# Patient Record
Sex: Female | Born: 1950 | Race: White | Hispanic: No
Health system: Southern US, Community
[De-identification: ages and names within clinical notes are randomized; demographics above are authoritative.]

---

## 2000-11-21 ENCOUNTER — Other Ambulatory Visit: Admission: RE | Admit: 2000-11-21 | Discharge: 2000-11-21 | Payer: Self-pay | Admitting: Gynecology

## 2004-07-03 ENCOUNTER — Other Ambulatory Visit: Admission: RE | Admit: 2004-07-03 | Discharge: 2004-07-03 | Payer: Self-pay | Admitting: Gynecology

## 2012-09-09 ENCOUNTER — Ambulatory Visit: Payer: Self-pay | Admitting: Family Medicine

## 2013-11-10 ENCOUNTER — Ambulatory Visit: Payer: Self-pay | Admitting: Internal Medicine

## 2013-11-11 ENCOUNTER — Ambulatory Visit: Payer: Self-pay | Admitting: Internal Medicine

## 2013-11-16 ENCOUNTER — Ambulatory Visit: Payer: Self-pay | Admitting: Internal Medicine

## 2013-11-20 LAB — PATHOLOGY REPORT

## 2014-10-11 IMAGING — MG MM DIGITAL SCREENING BILAT W/ CAD
1 series · 8 of 8 positions shown · non-contrast
Comparison: Previous exam(s).

CLINICAL DATA: Screening.

EXAM:
DIGITAL SCREENING BILATERAL MAMMOGRAM WITH CAD
The patient has retropectoral implants. Standard and implant
displaced views were performed.

[R CC · right · 8 of 8 slices shown]
[im 1/8]
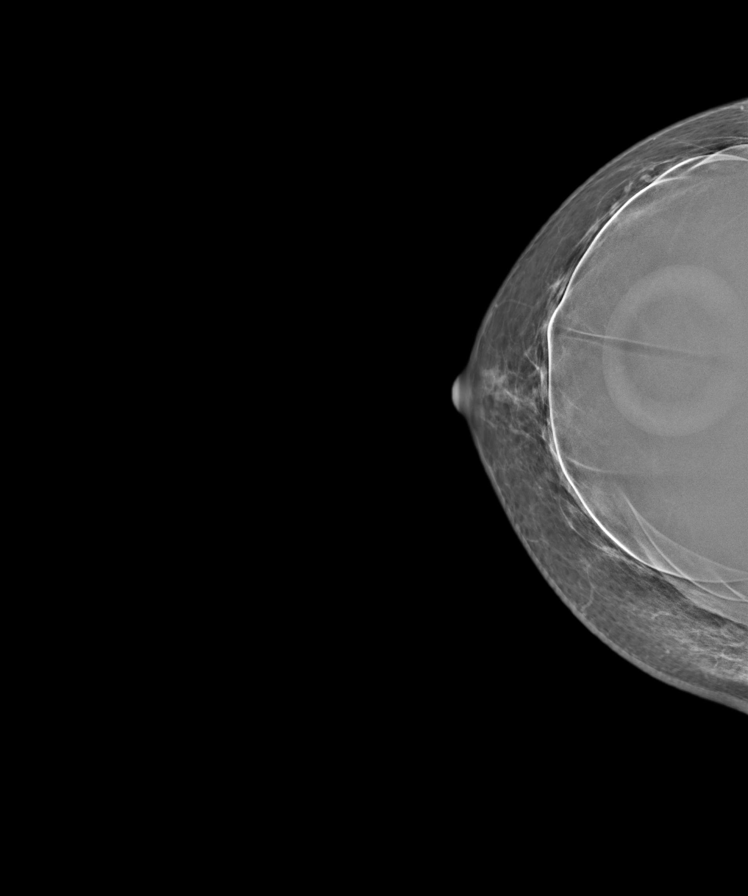
[im 2/8]
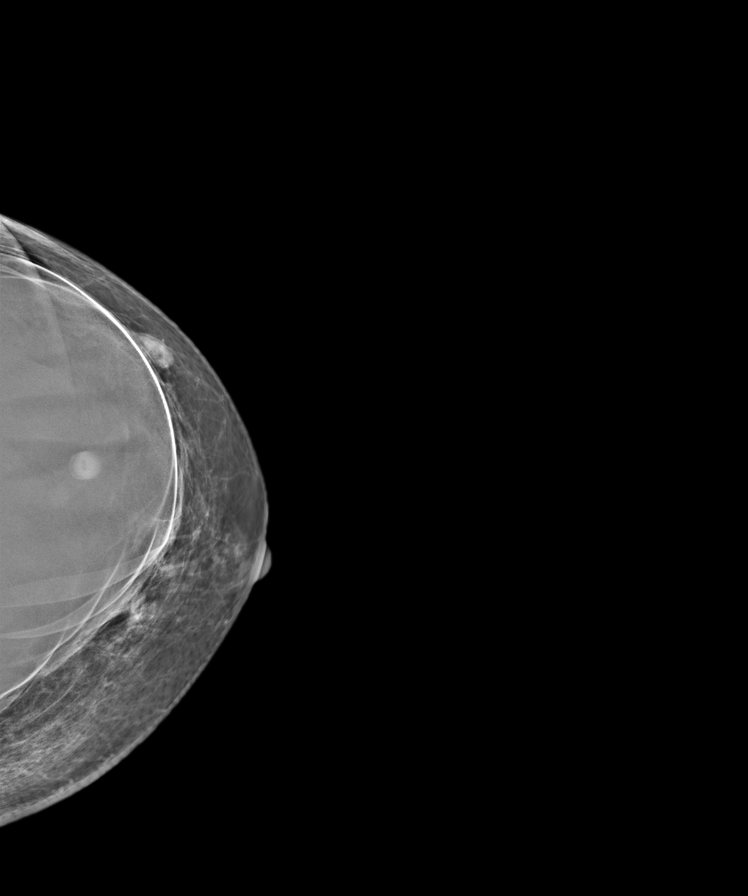
[im 3/8]
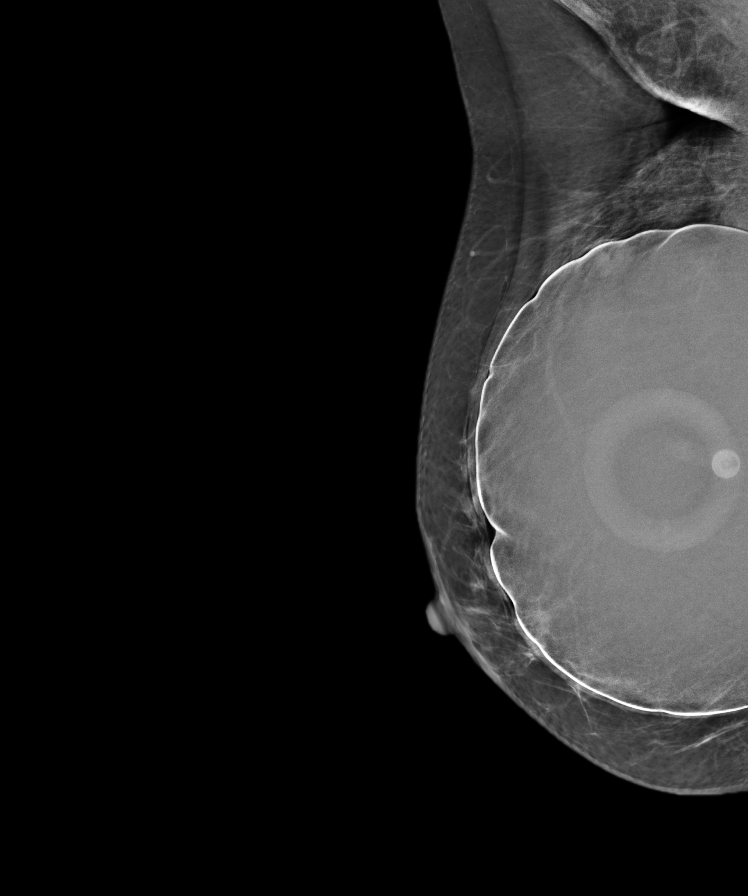
[im 4/8]
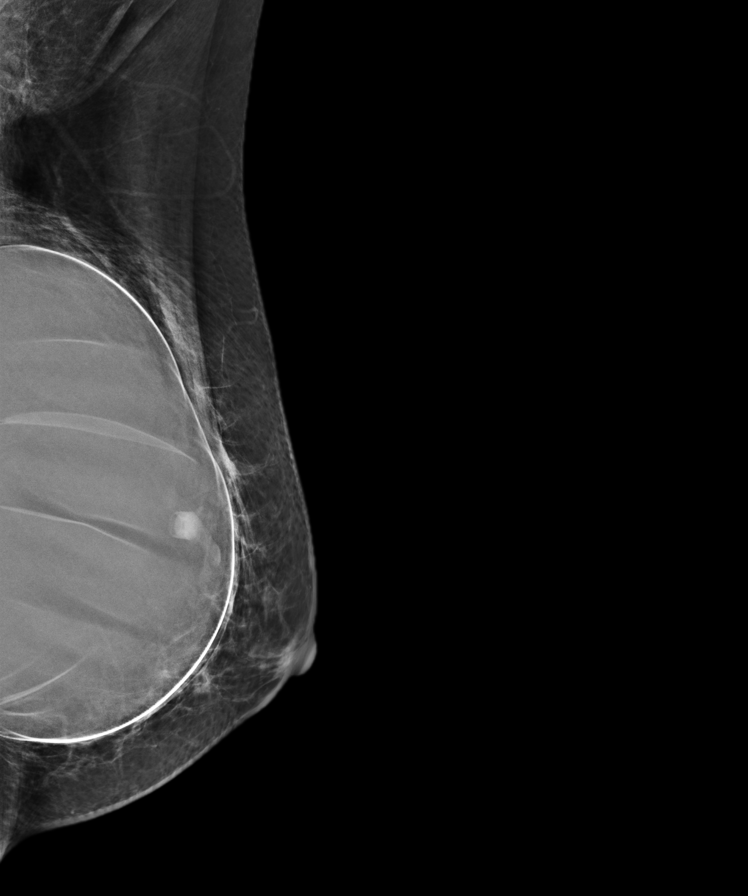
[im 5/8]
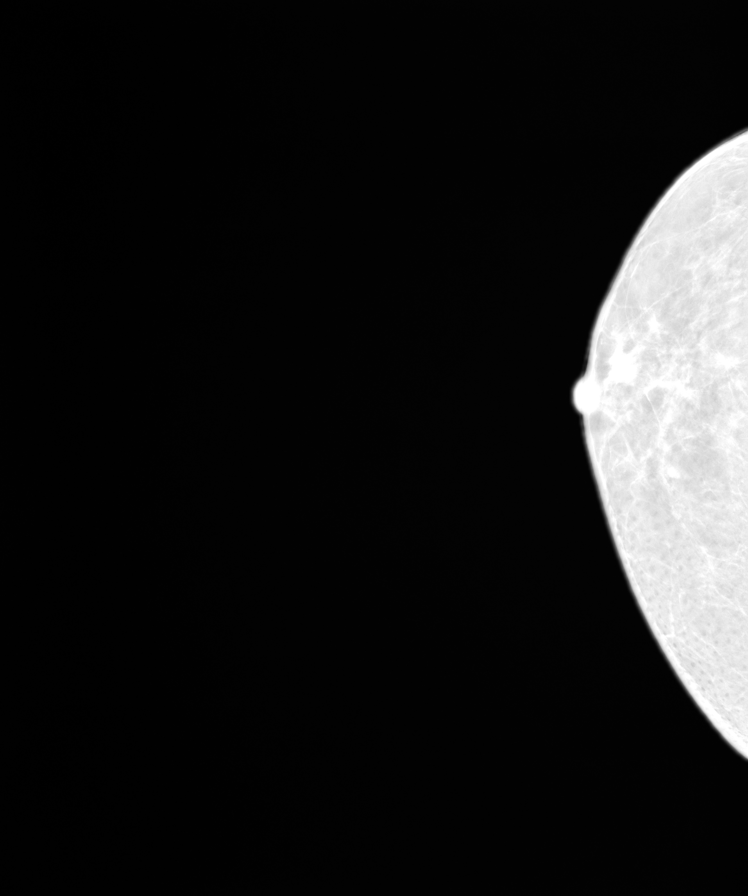
[im 6/8]
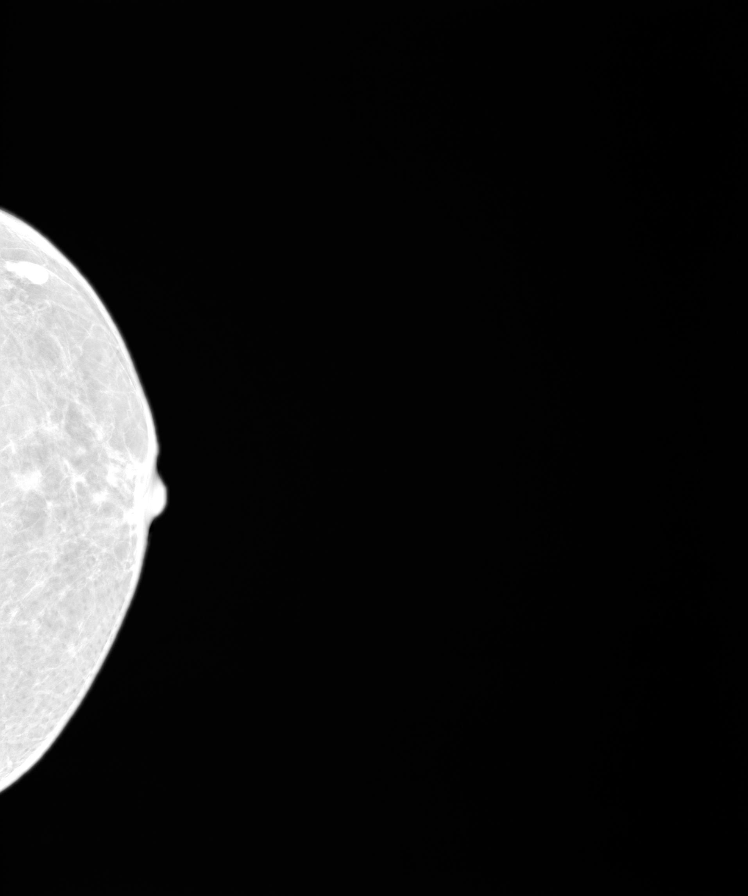
[im 7/8]
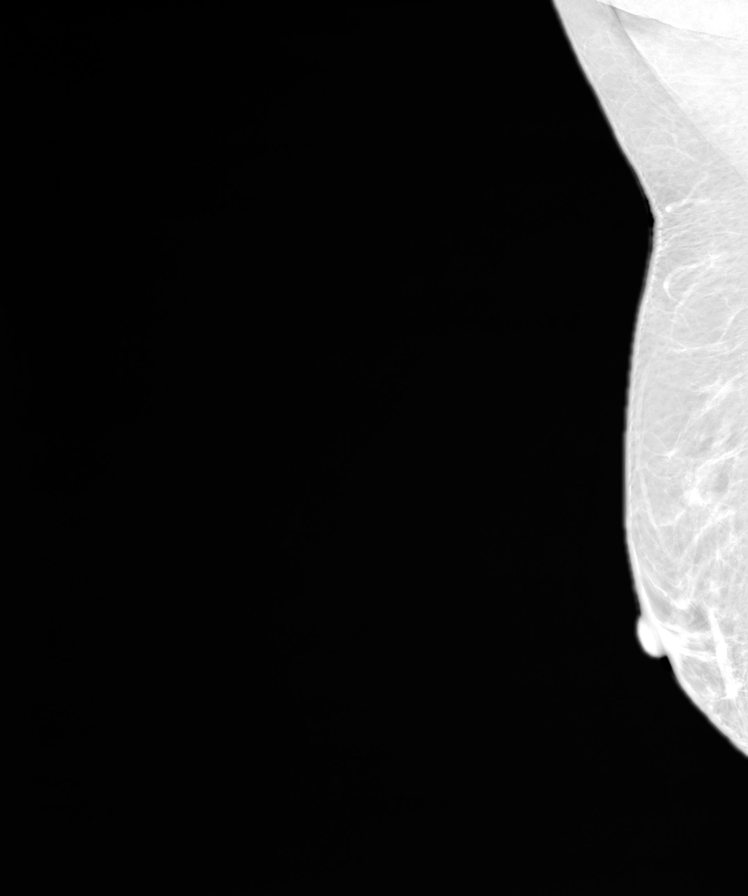
[im 8/8]
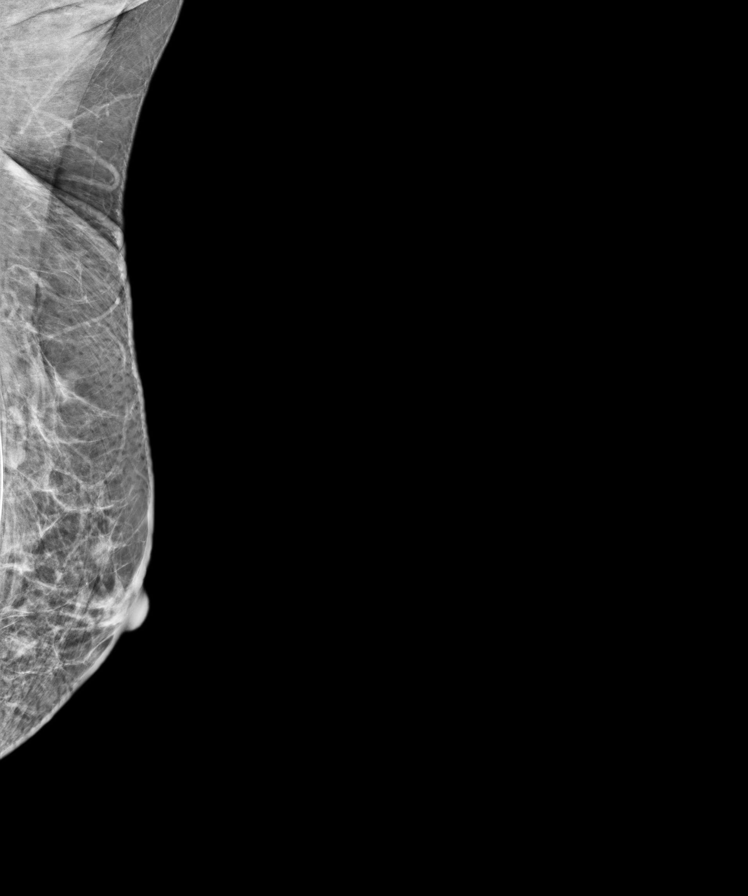

[8 of 8 positions shown; findings below may reference images not displayed]

ACR Breast Density Category b: There are scattered areas of
fibroglandular density.
FINDINGS: In the left breast, a possible asymmetry warrants further evaluation
with spot compression views and possibly ultrasound. In the right
breast, no suspicious masses or malignant type calcifications are
identified. Images were processed with CAD.
IMPRESSION: Further evaluation is suggested for possible asymmetry in the left
breast.

RECOMMENDATION:
Diagnostic mammogram and possibly ultrasound of the left breast.
(Code:FG-H-CC2)

The patient will be contacted regarding the findings, and additional
imaging will be scheduled.

BI-RADS CATEGORY  0: Incomplete. Need additional imaging evaluation
and/or prior mammograms for comparison.

## 2014-10-12 IMAGING — US US BREAST*L* LIMITED INC AXILLA
1 series · 5 of 5 positions shown · non-contrast
Comparison: 11/10/2013 and prior mammograms dating back to
05/11/2010

CLINICAL DATA: 63-year-old female with possible left breast mass on
screening mammogram.

EXAM:
DIGITAL DIAGNOSTIC  LEFT MAMMOGRAM
ULTRASOUND LEFT BREAST

[Series 1: us breast*left* limited inc axilla · 0.06mm/px · 5 of 5 slices shown]
[im 1/5]
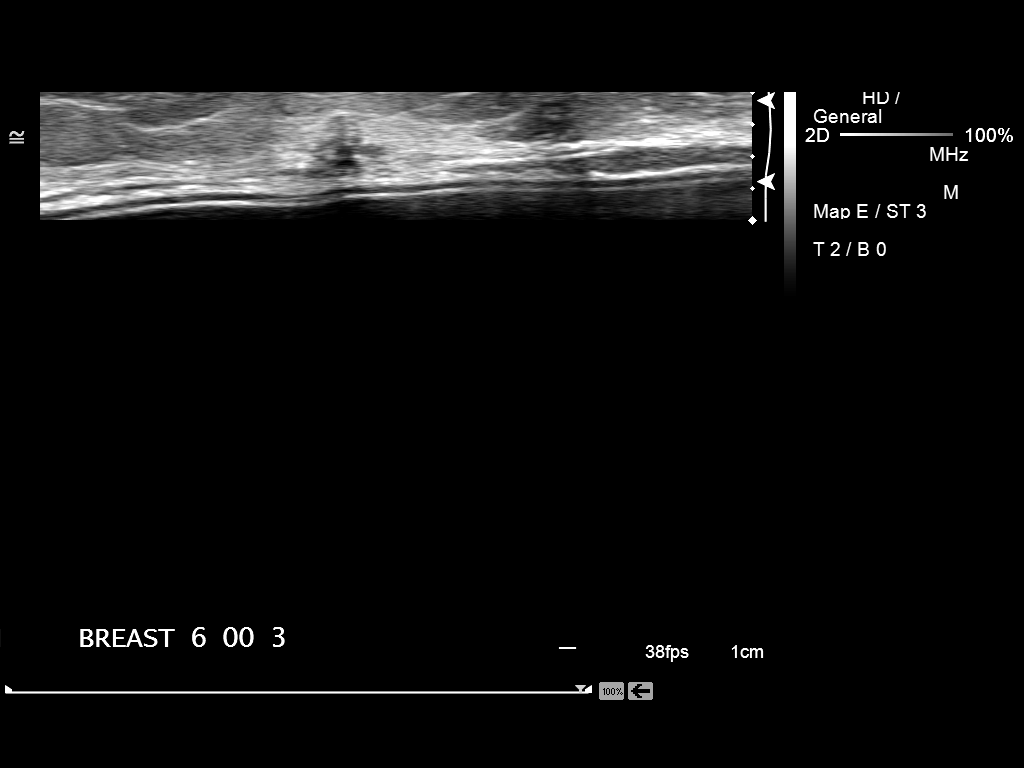
[im 2/5]
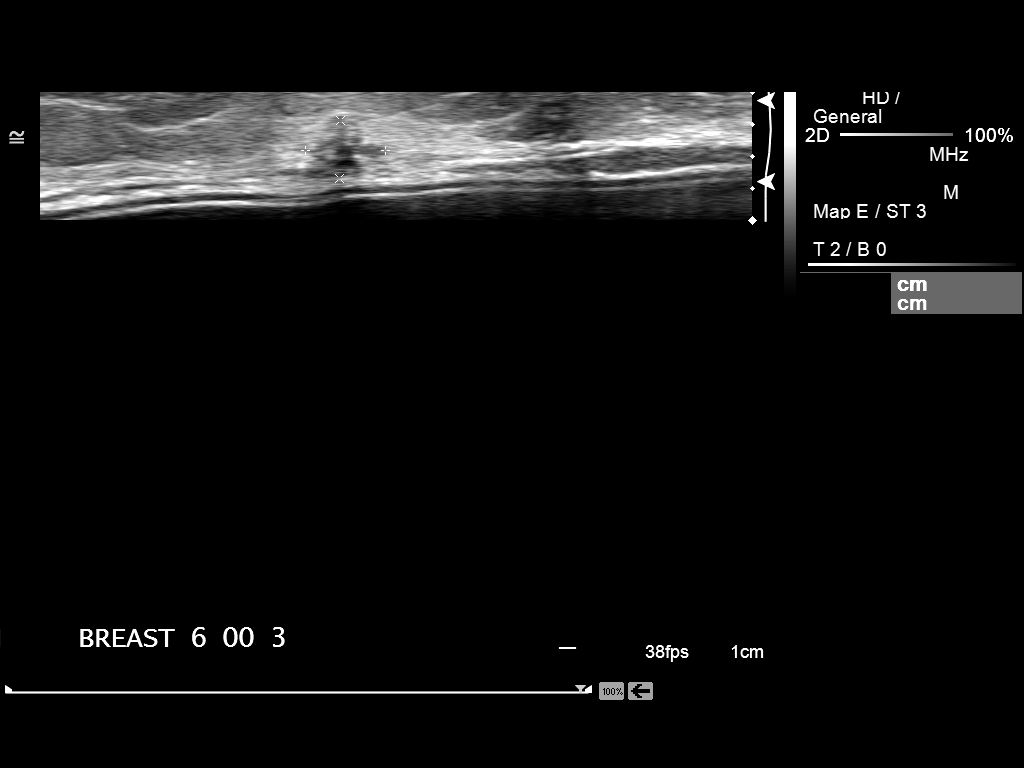
[im 3/5]
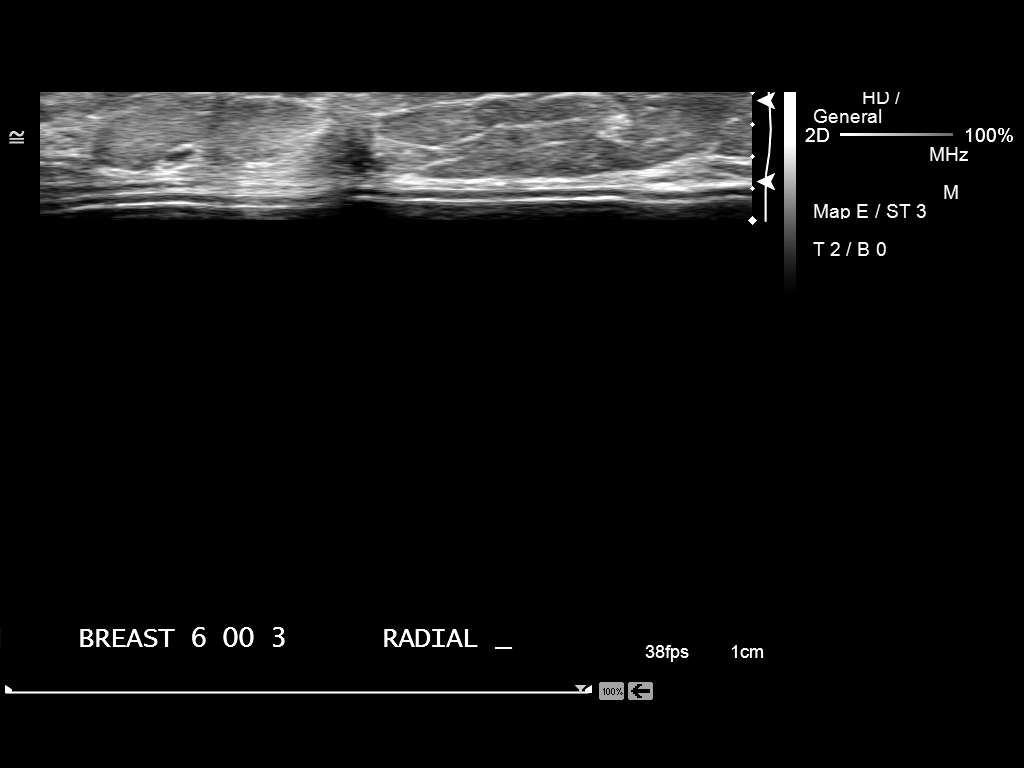
[im 4/5]
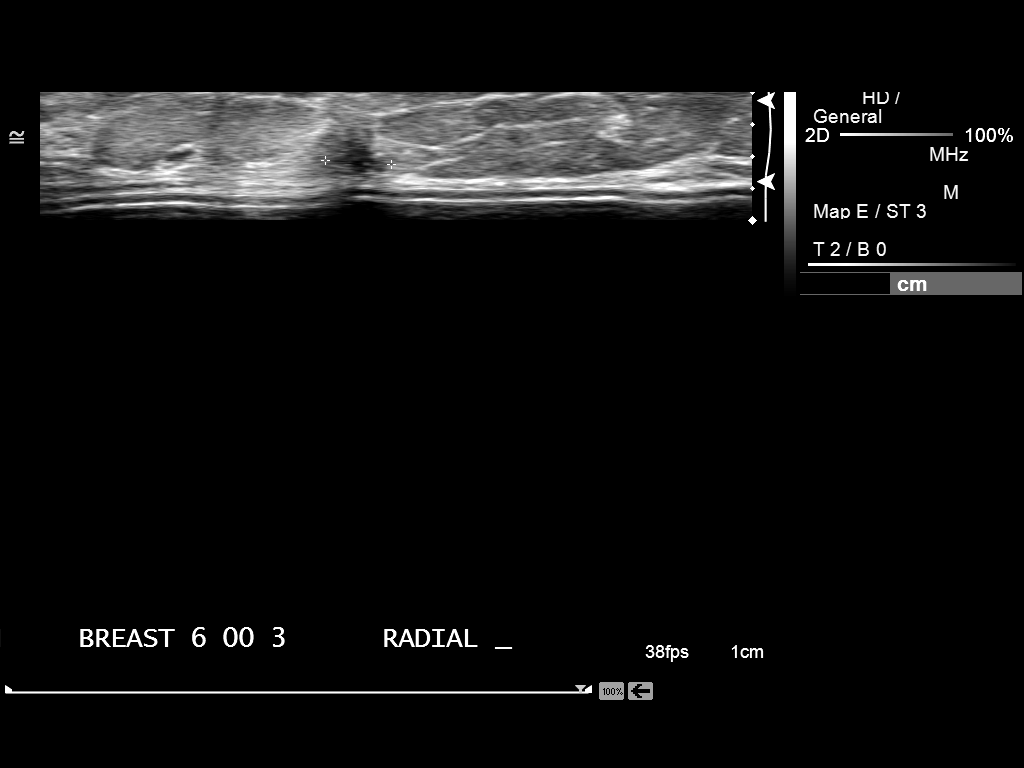
[im 5/5]
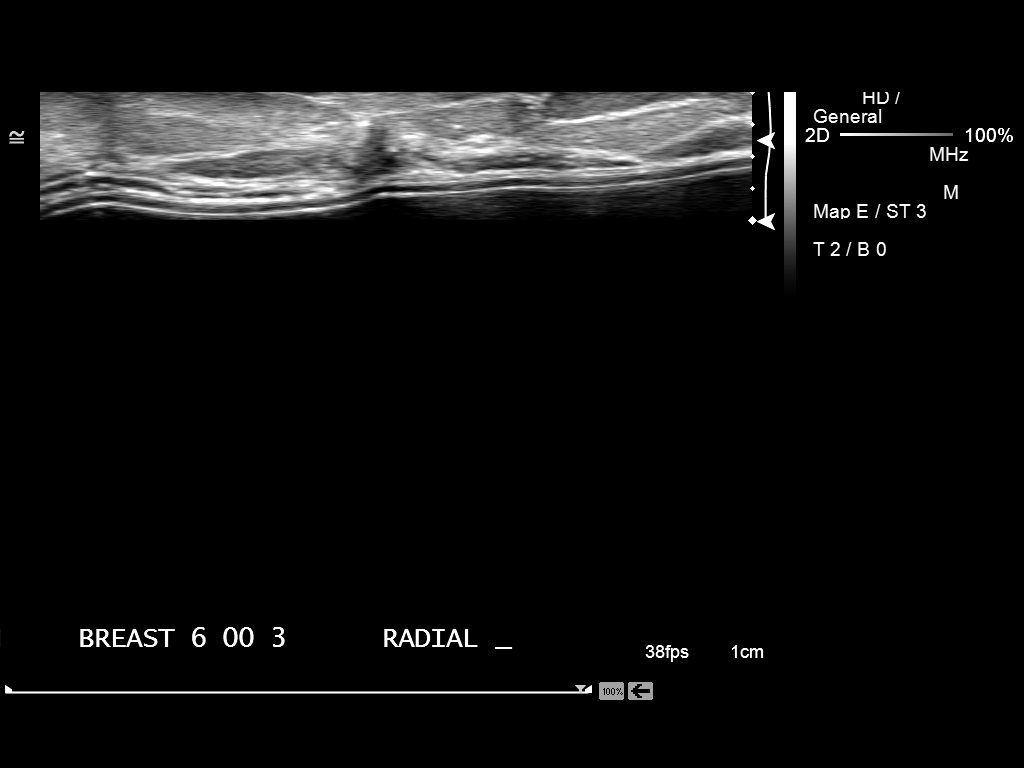

[5 of 5 positions shown; findings below may reference images not displayed]

ACR Breast Density Category b: There are scattered areas of
fibroglandular density.
FINDINGS: Spot compression views the left breast demonstrate a 7 mm irregular
mass within the slightly lower left breast.

On physical exam, no palpable abnormalities are identified within
the lower left breast.

Ultrasound is performed, showing a 4 x 5 x 4 mm irregular hypoechoic
mass at the 6 o'clock position of the left breast 3 cm from the
nipple, likely representing the screening study finding. This mass
lies directly adjacent to the patient's implant.

No enlarged or abnormal appearing left axillary lymph nodes are
identified.
IMPRESSION: Suspicious 4 x 5 mm mass in the lower left breast - tissue sampling
is recommended. Given the location of this mass directly adjacent to
the patient's implant, stereotactic guided biopsy will first be
attempted and if not technically feasible, then possible ultrasound
guided biopsy may be attempted.

RECOMMENDATION:
Tissue sampling of the left breast mass as described above.
Stereotactic guided biopsy will first be attempted and performed if
technically feasible. If stereotactic guided biopsy cannot be
performed, ultrasound-guided biopsy may be attempted. If the risk of
implant rupture is deemed too great for ultrasound-guided biopsy
then ultrasound-guided wire localization and surgical excision would
be recommended.

I have discussed the findings and recommendations with the patient.
Results were also provided in writing at the conclusion of the
visit. If applicable, a reminder letter will be sent to the patient
regarding the next appointment.

BI-RADS CATEGORY  4: Suspicious.

## 2014-10-17 IMAGING — CR MM BREAST BX W LOC DEV 1ST LESION IMAGE BX SPEC STEREO GUIDE
1 series · 1 of 1 positions shown · non-contrast
Comparison: Previous exams.

ADDENDUM:
Pathology has returned. Pathology reveals invasive lobular
carcinoma, classic type. This is concordant with the imaging.

I called the patient with these results on 11/17/2013 at [DATE] p.m..
referral.
Patient also reported no problems with the biopsy site.
CLINICAL DATA: Patient presents for stereotactic biopsy of a
developing asymmetry in the left breast.
EXAM:
LEFT BREAST STEREOTACTIC CORE NEEDLE BIOPSY

[LM]
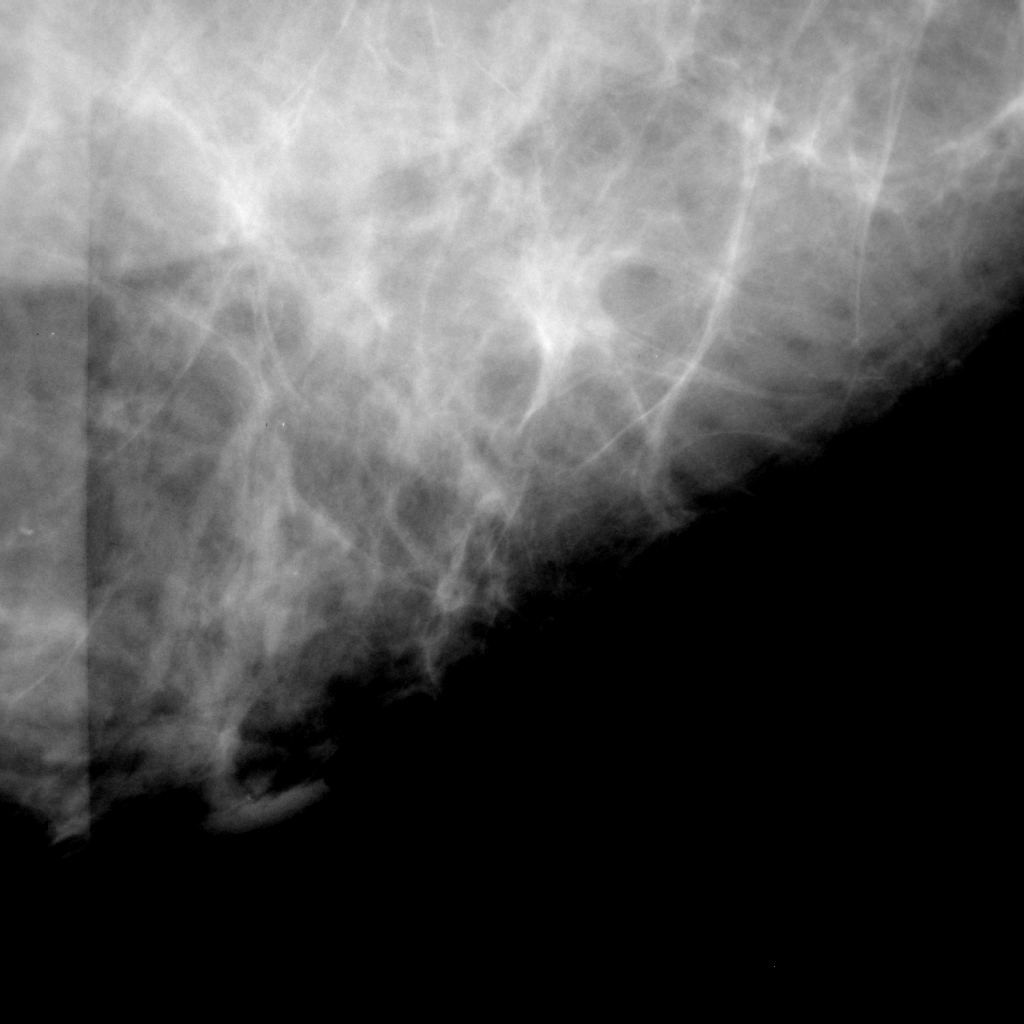

[1 of 1 positions shown; findings below may reference images not displayed]



Using sterile technique and 2% Lidocaine as local anesthetic, under
stereotactic guidance, a 12 gauge vacuum assist device was used to
perform core needle biopsy of asymmetry in the 6 o'clock position of
the left breast using a lateral to medial approach. Specimens with
the asymmetric breast tissue was sent for pathology.

At the conclusion of the procedure, a T-shaped tissue marker clip
was deployed into the biopsy cavity. Follow-up 2-view mammogram
confirmed clip placement in the region of the developing asymmetry.
IMPRESSION: Stereotactic-guided biopsy of a left breast developing asymmetry. No
apparent complications.

Recommendation: If pathology is negative for breast carcinoma,
six-month follow-up left breast mammography and ultrasound
recommended for further confirmation of successful sampling and to
re-evaluate the ultrasound abnormality seen on the diagnostic
mammographic study.

## 2021-03-17 ENCOUNTER — Other Ambulatory Visit: Payer: Self-pay

## 2021-03-17 ENCOUNTER — Ambulatory Visit (LOCAL_COMMUNITY_HEALTH_CENTER): Payer: Medicare Other

## 2021-03-17 DIAGNOSIS — Z23 Encounter for immunization: Secondary | ICD-10-CM | POA: Diagnosis not present

## 2021-03-17 NOTE — Progress Notes (Signed)
Pt arrived at [facility] today for flu vaccine and was inadvertently directed to location (epi room/ [facility]) where [facility] was administering flu vaccines to [employer]. [facility] staff screened and administered flu vaccine to pt. [facility] has been updated by RN and copy sent for scanning. Flu consent  with vaccine info sent for scanning. RN in [facility] did not see pt today. Jerel Shepherd, RN
